# Patient Record
Sex: Female | Born: 1970 | Race: White | Hispanic: No | Marital: Married | Smoking: Never smoker
Health system: Southern US, Community
[De-identification: ages and names within clinical notes are randomized; demographics above are authoritative.]

## PROBLEM LIST (undated history)

## (undated) HISTORY — PX: ABDOMINAL HYSTERECTOMY: SHX81

---

## 1997-11-12 ENCOUNTER — Other Ambulatory Visit: Admission: RE | Admit: 1997-11-12 | Discharge: 1997-11-12 | Payer: Self-pay | Admitting: Obstetrics & Gynecology

## 2000-06-29 ENCOUNTER — Other Ambulatory Visit: Admission: RE | Admit: 2000-06-29 | Discharge: 2000-06-29 | Payer: Self-pay | Admitting: Obstetrics & Gynecology

## 2001-08-12 ENCOUNTER — Other Ambulatory Visit: Admission: RE | Admit: 2001-08-12 | Discharge: 2001-08-12 | Payer: Self-pay | Admitting: Obstetrics & Gynecology

## 2002-03-08 ENCOUNTER — Encounter: Payer: Self-pay | Admitting: Obstetrics & Gynecology

## 2002-03-08 ENCOUNTER — Inpatient Hospital Stay (HOSPITAL_COMMUNITY): Admission: EM | Admit: 2002-03-08 | Discharge: 2002-03-08 | Payer: Self-pay | Admitting: Emergency Medicine

## 2002-03-08 ENCOUNTER — Encounter: Payer: Self-pay | Admitting: Emergency Medicine

## 2002-07-24 ENCOUNTER — Other Ambulatory Visit: Admission: RE | Admit: 2002-07-24 | Discharge: 2002-07-24 | Payer: Self-pay | Admitting: Obstetrics & Gynecology

## 2003-02-10 ENCOUNTER — Other Ambulatory Visit: Admission: RE | Admit: 2003-02-10 | Discharge: 2003-02-10 | Payer: Self-pay | Admitting: Obstetrics & Gynecology

## 2003-10-06 ENCOUNTER — Observation Stay (HOSPITAL_COMMUNITY): Admission: RE | Admit: 2003-10-06 | Discharge: 2003-10-07 | Payer: Self-pay | Admitting: Obstetrics & Gynecology

## 2004-06-22 ENCOUNTER — Other Ambulatory Visit: Admission: RE | Admit: 2004-06-22 | Discharge: 2004-06-22 | Payer: Self-pay | Admitting: Obstetrics & Gynecology

## 2005-08-02 ENCOUNTER — Other Ambulatory Visit: Admission: RE | Admit: 2005-08-02 | Discharge: 2005-08-02 | Payer: Self-pay | Admitting: Obstetrics & Gynecology

## 2009-12-16 ENCOUNTER — Encounter: Admission: RE | Admit: 2009-12-16 | Discharge: 2009-12-16 | Payer: Self-pay | Admitting: Obstetrics & Gynecology

## 2010-03-23 ENCOUNTER — Encounter: Admission: RE | Admit: 2010-03-23 | Discharge: 2010-03-23 | Payer: Self-pay | Admitting: Obstetrics & Gynecology

## 2011-02-17 ENCOUNTER — Other Ambulatory Visit: Payer: Self-pay | Admitting: Dermatology

## 2011-02-24 NOTE — H&P (Signed)
[facility]  Patient:    Shannon Park, Shannon Park Visit Number: [id number] MRN: [id number]          Service Type: Attending:  Duane Lope, M.D. Dictated by:   Duane Lope, M.D. Adm. Date:  03/08/02                           History and Physical  HISTORY OF PRESENT ILLNESS:  The patient is a 40 year old white female, gravida 4, para 2, abortus 2 with last menstrual period around May 8 who was in her usual state of good health. She was actually shopping at [facility] about 9:00 last night, was walking in the parking lot and had acute onset of right lower quadrant pain that took her breath away and almost did not allow her to make it to the care. The pain, she stated, was the worst she has ever had including her vaginal delivery. The patient came to the emergency room and I was called about 1:00 regarding the patient. She had a normal white count, slight left shift. All of her laboratory data were normal and her pregnancy test was negative. The impression was that she could have an ovarian cyst with a possible torsion. By this time, she was given Toradol and her pain was significantly better. She was admitted to the hospital then for this diagnosis and kept n.p.o.  PAST MEDICAL HISTORY:  Negative.  PAST SURGICAL HISTORY:  Cesarean section.  PAST OB HISTORY:  Two miscarriages, one cesarean section, one vaginal delivery.  ALLERGIES:  CODEINE.  MEDICATIONS:  None.  REVIEW OF SYSTEMS:  Per the HPI but otherwise completely negative.  PHYSICAL EXAMINATION:  HEENT:  Unremarkable. Normal thyroid.  LUNGS:  Clear.  HEART:  Regular rate and rhythm without murmur, regurg or gallop.  BREASTS:  Deferred.  ABDOMEN:  Has severe tenderness in the right lower quadrant with rebound. The rest of the abdomen is benign.  PELVIC:  Deferred because of the patients severe pain.  EXTREMITIES:  Warm with no edema.  NEUROLOGIC:  Grossly intact.  I reviewed the sonogram  with the ultrasound technologist and the patient has an approximately 6 x 5 cm right ovarian cyst with blood flow pattern consistent with torsion. It is less on the left side and there is no free fluid in the cul-de-sac and it is a complex cyst consistent with hemorrhagic corpus luteum or possibly endometrioma.  IMPRESSION:  1. Right ovarian cyst with torsion.  2. Severe right lower quadrant pain.  PLAN:  The patient will be admitted to the hospital and will undergo a laparoscopic cystectomy. She also has pain with her periods and will do a laparoscopic uterosacral nerve oblation as well. She understands the risks, benefits, indications and alternatives and will proceed. She understands she could lose her ovary. Dictated by:   Duane Lope, M.D. Attending:  Duane Lope, M.D. DD:  03/08/02 TD:  03/08/02 Job: [id number]

## 2011-02-24 NOTE — Op Note (Signed)
[facility]  Patient:    Shannon Park, Shannon Park Visit Number: [id number] MRN: [id number]          Service Type: MED Location: 4A A416 [id number] Attending Physician:  Herbert Seta Dictated by:   Duane Lope, M.D. Proc. Date: 03/08/02 Admit Date:  03/07/2002                             Operative Report  PREOPERATIVE DIAGNOSES 1. A 6.0 cm x 5.0 cm right ovarian cyst. 2. Right ovarian torsion. 3. Severe acute right lower quadrant pain.  POSTOPERATIVE DIAGNOSES 1. A 6.0 cm x 5.0 cm right ovarian cyst. 2. Right ovarian torsion. 3. Severe acute right lower quadrant pain.  PROCEDURES 1. Laparoscopic right ovarian cystectomy. 2. Laparoscopic uterosacral nerve ablation.  SURGEON:  Duane Lope, M.D.  ANESTHESIA:  General endotracheal.  FINDINGS:  The patient had a large cyst found at the time of laparoscopy.  It was on the right ovary, and agreed with the ultrasound of approximately 6.0 cm x 5.0 cm.  It had blood and clear fluid in it.  It appeared from the cyst wall to be a hemorrhagic corpus luteum.  There was about 200 cc of fluid in the cyst itself, a lot of which was blood.  The left ovary was normal.  The posterior cul-de-sac, uterus, and both tubes were normal.  There was no evidence of endometriosis, and this certainly was not an endometrioma.  The appendix was normal.  DESCRIPTION OF OPERATION PERFORMED:  The patient was taken to the operating room and placed in the supine position, where she underwent general endotracheal anesthesia.  The vagina was prepped.  A Foley catheter was placed.  The abdomen was then prepped, and her lower extremities and abdomen were draped in the usual sterile fashion.  Marcaine 0.5% with 1:200,000 epinephrine was then injected subcu in the umbilical, suprapubic, and right lower quadrant incision areas.  An incision was made in the umbilicus.  A pistol grip was used, and under direct visualization the trocar was placed  in the peritoneal cavity and passed without difficulty.  The abdomen was insufflated.  Two finger breadths above the pubis an incision was made.  A 5.0 mm trocar was placed in the peritoneal cavity and passed without difficulty. An area was identified away from the inferior epigastric vessels in the right lower quadrant.  An incision was made and a 5.0 mm trocar was placed under direct visualization.  The ovary was cauterized on the surface and opened up. Fluid began to come out, and this was sucked out with the suction system, and 200 cc of fluid was in the cyst itself.  I then used a hair curler technique and separated the cyst from the ovarian wall, and removed the cyst in toto without difficulty.  The bed of the cyst was bleeding fairly significantly in the area of the infundibular pelvic ligament.  I tried monopolar and harmonic scalpel cautery, and finally had to use bipolar cautery to get the area to stop bleeding.  I did not compromise the ovarian vessels. There was good hemostasis.  The pelvis was irrigated vigorously several times.  A laparoscopic uterosacral nerve ablation was performed using the harmonic scalpel without difficulty, with good hemostasis.  The pelvis was once again irrigated vigorously.  All pedicles were found to be hemostatic.  The instruments were removed.  The gas was allowed to escape.  The fascia was closed with a  single #0 Vicryl suture.  All skin incisions were closed using #3-0 Vicryl in a subcuticular fashion.  The patient tolerated the procedure well.  She experienced 300 cc of blood loss, and was taken to the recovery room in good stable condition.  All counts were correct x 3. Dictated by:   Duane Lope, M.D. Attending Physician:  Herbert Seta DD:  03/08/02 TD:  03/10/02 Job: [id number]

## 2011-02-24 NOTE — Discharge Summary (Signed)
[facility]  Patient:    Shannon Park, Shannon Park Visit Number: [id number] MRN: [id number]          Service Type: MED Location: 4A A416 01 Attending Physician:  Lazaro Arms Dictated by:   Duane Lope, M.D. Admit Date:  03/07/2002 Discharge Date: 03/08/2002                             Discharge Summary  DISCHARGE DIAGNOSES: 1. Status post laparoscopic right ovarian cystectomy. 2. Status post laparoscopic ureterosacral nerve oblation. 3. Right ovarian cyst with torsion.  PROCEDURES:  As above.  Please refer to the transcribed history and physical with details of admission to the hospital.  HOSPITAL COURSE:  The patient was admitted, was observed for a short period of time and had an ultrasound, felt as though she had diminished blood flow to the right ovary as compared to the left and also had a large cyst consistent with a cyst with torsion and her pain was consistent. As a result, she was taken for a laparoscopic right ovarian cystectomy and I did a laparoscopic uterosacral nerve oblation as well because of her dysmenorrhea. The procedure went well. She had an unremarkable postoperative course except had some uvular edema, otherwise, her abdomen was benign. She tolerated clear liquids and her pain medicine. She was discharged to home just a few hours after surgery to take Tylox for pain, Toradol around the clock every 8 hours for 5 days and she will be seen in the office in a couple of days for evaluation. She was given instructions to gargle and use Chloraseptic because of her enlarged uvula. She will be seen in the office in a couple of days and was given instructions and precautions for return prior to that time. Dictated by:   Duane Lope, M.D.  Attending Physician:  Lazaro Arms DD:  03/31/02 TD:  03/31/02 Job: [id number]

## 2014-12-28 ENCOUNTER — Other Ambulatory Visit: Payer: Self-pay

## 2014-12-29 LAB — CYTOLOGY - PAP

## 2019-10-24 ENCOUNTER — Ambulatory Visit: Payer: BC Managed Care – PPO | Attending: Internal Medicine

## 2019-10-24 ENCOUNTER — Other Ambulatory Visit: Payer: Self-pay

## 2019-10-24 DIAGNOSIS — Z20822 Contact with and (suspected) exposure to covid-19: Secondary | ICD-10-CM

## 2019-10-24 DIAGNOSIS — U071 COVID-19: Secondary | ICD-10-CM | POA: Insufficient documentation

## 2019-10-25 LAB — NOVEL CORONAVIRUS, NAA: SARS-CoV-2, NAA: DETECTED — AB

## 2019-11-11 ENCOUNTER — Emergency Department (HOSPITAL_COMMUNITY)
Admission: EM | Admit: 2019-11-11 | Discharge: 2019-11-11 | Disposition: A | Discharge status: Home / Self Care | Payer: BC Managed Care – PPO | Attending: Emergency Medicine | Admitting: Emergency Medicine

## 2019-11-11 ENCOUNTER — Ambulatory Visit (INDEPENDENT_AMBULATORY_CARE_PROVIDER_SITE_OTHER)
Admission: EM | Admit: 2019-11-11 | Discharge: 2019-11-11 | Disposition: A | Discharge status: Home / Self Care | Payer: BC Managed Care – PPO | Source: Home / Self Care

## 2019-11-11 ENCOUNTER — Encounter (HOSPITAL_COMMUNITY): Payer: Self-pay

## 2019-11-11 ENCOUNTER — Other Ambulatory Visit: Payer: Self-pay

## 2019-11-11 ENCOUNTER — Emergency Department (HOSPITAL_COMMUNITY): Payer: BC Managed Care – PPO

## 2019-11-11 DIAGNOSIS — R071 Chest pain on breathing: Secondary | ICD-10-CM | POA: Diagnosis not present

## 2019-11-11 DIAGNOSIS — Z885 Allergy status to narcotic agent status: Secondary | ICD-10-CM | POA: Insufficient documentation

## 2019-11-11 DIAGNOSIS — U071 COVID-19: Secondary | ICD-10-CM | POA: Diagnosis not present

## 2019-11-11 DIAGNOSIS — R0789 Other chest pain: Secondary | ICD-10-CM | POA: Diagnosis present

## 2019-11-11 DIAGNOSIS — Z79899 Other long term (current) drug therapy: Secondary | ICD-10-CM | POA: Diagnosis not present

## 2019-11-11 LAB — CBC WITH DIFFERENTIAL/PLATELET
Abs Immature Granulocytes: 0.02 10*3/uL (ref 0.00–0.07)
Basophils Absolute: 0 10*3/uL (ref 0.0–0.1)
Basophils Relative: 0 %
Eosinophils Absolute: 0.1 10*3/uL (ref 0.0–0.5)
Eosinophils Relative: 2 %
HCT: 37.8 % (ref 36.0–46.0)
Hemoglobin: 12.8 g/dL (ref 12.0–15.0)
Immature Granulocytes: 0 %
Lymphocytes Relative: 15 %
Lymphs Abs: 0.9 10*3/uL (ref 0.7–4.0)
MCH: 31.1 pg (ref 26.0–34.0)
MCHC: 33.9 g/dL (ref 30.0–36.0)
MCV: 92 fL (ref 80.0–100.0)
Monocytes Absolute: 0.4 10*3/uL (ref 0.1–1.0)
Monocytes Relative: 7 %
Neutro Abs: 4.7 10*3/uL (ref 1.7–7.7)
Neutrophils Relative %: 76 %
Platelets: 209 10*3/uL (ref 150–400)
RBC: 4.11 MIL/uL (ref 3.87–5.11)
RDW: 11.9 % (ref 11.5–15.5)
WBC: 6.1 10*3/uL (ref 4.0–10.5)
nRBC: 0 % (ref 0.0–0.2)

## 2019-11-11 LAB — COMPREHENSIVE METABOLIC PANEL
ALT: 18 U/L (ref 0–44)
AST: 18 U/L (ref 15–41)
Albumin: 4.1 g/dL (ref 3.5–5.0)
Alkaline Phosphatase: 63 U/L (ref 38–126)
Anion gap: 10 (ref 5–15)
BUN: 9 mg/dL (ref 6–20)
CO2: 27 mmol/L (ref 22–32)
Calcium: 9.4 mg/dL (ref 8.9–10.3)
Chloride: 100 mmol/L (ref 98–111)
Creatinine, Ser: 0.61 mg/dL (ref 0.44–1.00)
GFR calc Af Amer: >60 mL/min (ref >60)
GFR calc non Af Amer: >60 mL/min (ref >60)
Glucose, Bld: 109 mg/dL — ABNORMAL HIGH (ref 70–99)
Potassium: 4.6 mmol/L (ref 3.5–5.1)
Sodium: 137 mmol/L (ref 135–145)
Total Bilirubin: 0.6 mg/dL (ref 0.3–1.2)
Total Protein: 7.4 g/dL (ref 6.5–8.1)

## 2019-11-11 LAB — TROPONIN I (HIGH SENSITIVITY)
Troponin I (High Sensitivity): <2 ng/L (ref <18)
Troponin I (High Sensitivity): <2 ng/L (ref <18)

## 2019-11-11 MED ORDER — ALBUTEROL SULFATE HFA 108 (90 BASE) MCG/ACT IN AERS: 2.0000 | INHALATION_SPRAY | Freq: Four times a day (QID) | RESPIRATORY_TRACT | 0 refills | Status: AC | PRN

## 2019-11-11 MED ORDER — IOHEXOL 350 MG/ML SOLN
100.0000 mL | Freq: Once | INTRAVENOUS | Status: AC | PRN
  Administered 2019-11-11: 14:00:00 100 mL via INTRAVENOUS

## 2019-11-11 NOTE — ED Provider Notes (Signed)
[facility] EMERGENCY DEPARTMENT Provider Note   CSN: [id number] Arrival date & time: 11/11/19  1053     History Chief Complaint  Patient presents with  . Chest Pain    COVID + January 15th     Shannon Park is a 49 y.o. female.  Patient with history of Covid infection the last couple weeks.  Patient complains of some shortness of breath and neck and chest discomfort.  The history is provided by the patient.  Chest Pain Pain location:  Substernal area Pain quality: aching   Pain radiates to:  Does not radiate Pain severity:  Mild Onset quality:  Sudden Timing:  Intermittent Progression:  Waxing and waning Associated symptoms: no abdominal pain, no back pain, no cough, no fatigue and no headache        History reviewed. No pertinent past medical history.  There are no problems to display for this patient.   Past Surgical History:  Procedure Laterality Date  . ABDOMINAL HYSTERECTOMY    . CESAREAN SECTION       OB History   No obstetric history on file.     No family history on file.  Social History   Tobacco Use  . Smoking status: Never Smoker  . Smokeless tobacco: Never Used  Substance Use Topics  . Alcohol use: Yes    Comment: Occasional  . Drug use: Never    Home Medications Prior to Admission medications   Medication Sig Start Date End Date Taking? Authorizing Provider  Cyanocobalamin (VITAMIN B 12 PO) Take 1 tablet by mouth daily.   Yes [provider]  estradiol (ESTRACE) 2 MG tablet Take 2 mg by mouth daily.   Yes [provider]  Fluoxetine HCl, PMDD, (SARAFEM) 20 MG TABS Take 1 tablet by mouth daily.   Yes [provider]  MegaRed Omega-3 Krill Oil 500 MG CAPS Take 1 capsule by mouth daily.   Yes [provider]  Multiple Vitamin (MULTIVITAMIN WITH MINERALS) TABS tablet Take 1 tablet by mouth daily.   Yes [provider]  VITAMIN D PO Take 1 capsule by mouth daily.   Yes [provider]    albuterol (VENTOLIN HFA) 108 (90 Base) MCG/ACT inhaler Inhale 2 puffs into the lungs every 6 (six) hours as needed for wheezing or shortness of breath. 11/11/19   Milton Ferguson, MD    Allergies    Codeine  Review of Systems   Review of Systems  Constitutional: Negative for appetite change and fatigue.  HENT: Negative for congestion, ear discharge and sinus pressure.        Neck discomfort  Eyes: Negative for discharge.  Respiratory: Negative for cough.   Cardiovascular: Positive for chest pain.  Gastrointestinal: Negative for abdominal pain and diarrhea.  Genitourinary: Negative for frequency and hematuria.  Musculoskeletal: Negative for back pain.  Skin: Negative for rash.  Neurological: Negative for seizures and headaches.  Psychiatric/Behavioral: Negative for hallucinations.    Physical Exam Updated Vital Signs BP 129/78   Pulse 70   Temp 98 F (36.7 C) (Oral)   Resp 13   Ht 5\' 4"  (1.626 m)   Wt 56.7 kg   SpO2 100%   BMI 21.46 kg/m   Physical Exam Vitals and nursing note reviewed.  Constitutional:      Appearance: She is well-developed.  HENT:     Head: Normocephalic.     Nose: Nose normal.  Eyes:     General: No scleral icterus.  Conjunctiva/sclera: Conjunctivae normal.  Neck:     Thyroid: No thyromegaly.  Cardiovascular:     Rate and Rhythm: Normal rate and regular rhythm.     Heart sounds: No murmur. No friction rub. No gallop.   Pulmonary:     Breath sounds: No stridor. No wheezing or rales.  Chest:     Chest wall: No tenderness.  Abdominal:     General: There is no distension.     Tenderness: There is no abdominal tenderness. There is no rebound.  Musculoskeletal:        General: Normal range of motion.     Cervical back: Neck supple.  Lymphadenopathy:     Cervical: No cervical adenopathy.  Skin:    Findings: No erythema or rash.  Neurological:     Mental Status: She is oriented to person, place, and time.     Motor: No abnormal muscle  tone.     Coordination: Coordination normal.  Psychiatric:        Behavior: Behavior normal.     ED Results / Procedures / Treatments   Labs (all labs ordered are listed, but only abnormal results are displayed) Labs Reviewed  COMPREHENSIVE METABOLIC PANEL - Abnormal; Notable for the following components:      Result Value   Glucose, Bld 109 (*)    All other components within normal limits  CBC WITH DIFFERENTIAL/PLATELET  TROPONIN I (HIGH SENSITIVITY)  TROPONIN I (HIGH SENSITIVITY)    EKG EKG Interpretation  Date/Time:  Tuesday November 11 2019 11:47:46 EST Ventricular Rate:  69 PR Interval:    QRS Duration: 93 QT Interval:  403 QTC Calculation: 432 R Axis:   70 Text Interpretation: Sinus rhythm Confirmed by Bethann Berkshire [phone]) on 11/11/2019 2:35:29 PM   Radiology CT Angio Chest PE W and/or Wo Contrast  Result Date: 11/11/2019 CLINICAL DATA:  Diagnosed with COVID-19 on 10/24/2019, intermittent chest pain now radiating to neck, progressively worsening shortness of breath EXAM: CT ANGIOGRAPHY CHEST WITH CONTRAST TECHNIQUE: Multidetector CT imaging of the chest was performed using the standard protocol during bolus administration of intravenous contrast. Multiplanar CT image reconstructions and MIPs were obtained to evaluate the vascular anatomy. CONTRAST:  OMNIPAQUE IOHEXOL 350 MG/ML SOLN IV COMPARISON:  None FINDINGS: Cardiovascular: Aorta normal caliber without aneurysm or dissection. Minimal pericardial fluid. Heart otherwise unremarkable. Pulmonary arteries well opacified and patent. No evidence of pulmonary embolism. Mediastinum/Nodes: Esophagus unremarkable. Base of cervical region normal appearance. No thoracic adenopathy. Lungs/Pleura: Minimal dependent atelectasis in the posterior lower lobes. Lungs otherwise clear. No pulmonary infiltrate, pleural effusion or pneumothorax. 4 mm RIGHT upper lobe nodule image 34. 3 mm LEFT upper lobe nodule image 60. Subpleural nodule  5 mm diameter LEFT lower lobe image 85. Upper Abdomen: Unremarkable Musculoskeletal: No osseous findings. Review of the MIP images confirms the above findings. IMPRESSION: No evidence of pulmonary embolism. Minimal dependent atelectasis in the posterior lower lobes. BILATERAL lung nodules up to 5 mm diameter, recommendation below. No follow-up needed if patient is low-risk (and has no known or suspected primary neoplasm). Non-contrast chest CT can be considered in 12 months if patient is high-risk. This recommendation follows the consensus statement: Guidelines for Management of Incidental Pulmonary Nodules Detected on CT Images: From the Fleischner Society 2017; Radiology 2017; 284:228-243. Electronically Signed   By: Ulyses Southward M.D.   On: 11/11/2019 14:13    Procedures Procedures (including critical care time)  Medications Ordered in ED Medications  iohexol (OMNIPAQUE) 350 MG/ML injection 100 mL (  100 mLs Intravenous Contrast Given 11/11/19 1347)    ED Course  I have reviewed the triage vital signs and the nursing notes.  Pertinent labs & imaging results that were available during my care of the patient were reviewed by me and considered in my medical decision making (see chart for details).    MDM Rules/Calculators/A&P                      Patient with history of Covid infection.  Patient with neck discomfort along with some chest discomfort and shortness of breath.  Labs are unremarkable CT angio of the chest showed no blood clots.  Patient will be discharged home with albuterol inhaler and referred to ENT along with following up with her family doctor for blood pressure Final Clinical Impression(s) / ED Diagnoses Final diagnoses:  COVID-19    Rx / DC Orders ED Discharge Orders         Ordered    albuterol (VENTOLIN HFA) 108 (90 Base) MCG/ACT inhaler  Every 6 hours PRN     11/11/19 1450           Bethann Berkshire, MD 11/11/19 1454

## 2019-11-11 NOTE — ED Triage Notes (Signed)
Pt presents with c/o chest tightness, sob and neck pressure that progressively gotten worse for past 2 weeks , post covid diagnosis

## 2019-11-11 NOTE — Discharge Instructions (Signed)
Use your inhaler to help with shortness of breath.  Follow-up with your family doctor for your blood pressure return to the emergency department as needed.  And you can see the ENT doctor Friday neck problems

## 2019-11-11 NOTE — ED Triage Notes (Addendum)
Pt diagnosed with COVID January 15th. Has been having intermittent chest pain that is now radiating to her neck. SOB progressively worse. Pt is normally a healthy individual. EKG done at Urgent Care is at bedside.

## 2021-12-12 ENCOUNTER — Other Ambulatory Visit: Payer: Self-pay | Admitting: Obstetrics & Gynecology

## 2021-12-12 DIAGNOSIS — R928 Other abnormal and inconclusive findings on diagnostic imaging of breast: Secondary | ICD-10-CM

## 2021-12-27 ENCOUNTER — Ambulatory Visit: Payer: BC Managed Care – PPO

## 2021-12-27 ENCOUNTER — Ambulatory Visit
Admission: RE | Admit: 2021-12-27 | Discharge: 2021-12-27 | Disposition: A | Discharge status: Home / Self Care | Payer: BC Managed Care – PPO | Source: Ambulatory Visit | Attending: Obstetrics & Gynecology | Admitting: Obstetrics & Gynecology

## 2021-12-27 DIAGNOSIS — R928 Other abnormal and inconclusive findings on diagnostic imaging of breast: Secondary | ICD-10-CM

## 2024-03-18 IMAGING — MG MM DIGITAL DIAGNOSTIC UNILAT*R* W/ TOMO W/ CAD
4 series · 4 of 12 positions shown · non-contrast
Comparison: Prior films

CLINICAL DATA: Callback from screening mammogram for possible
asymmetry right breast

EXAM:
DIGITAL DIAGNOSTIC UNILATERAL RIGHT MAMMOGRAM WITH TOMOSYNTHESIS AND
CAD
TECHNIQUE: Right digital diagnostic mammography and breast tomosynthesis was
performed. The images were evaluated with computer-aided detection.

[R MLO synth-2D]
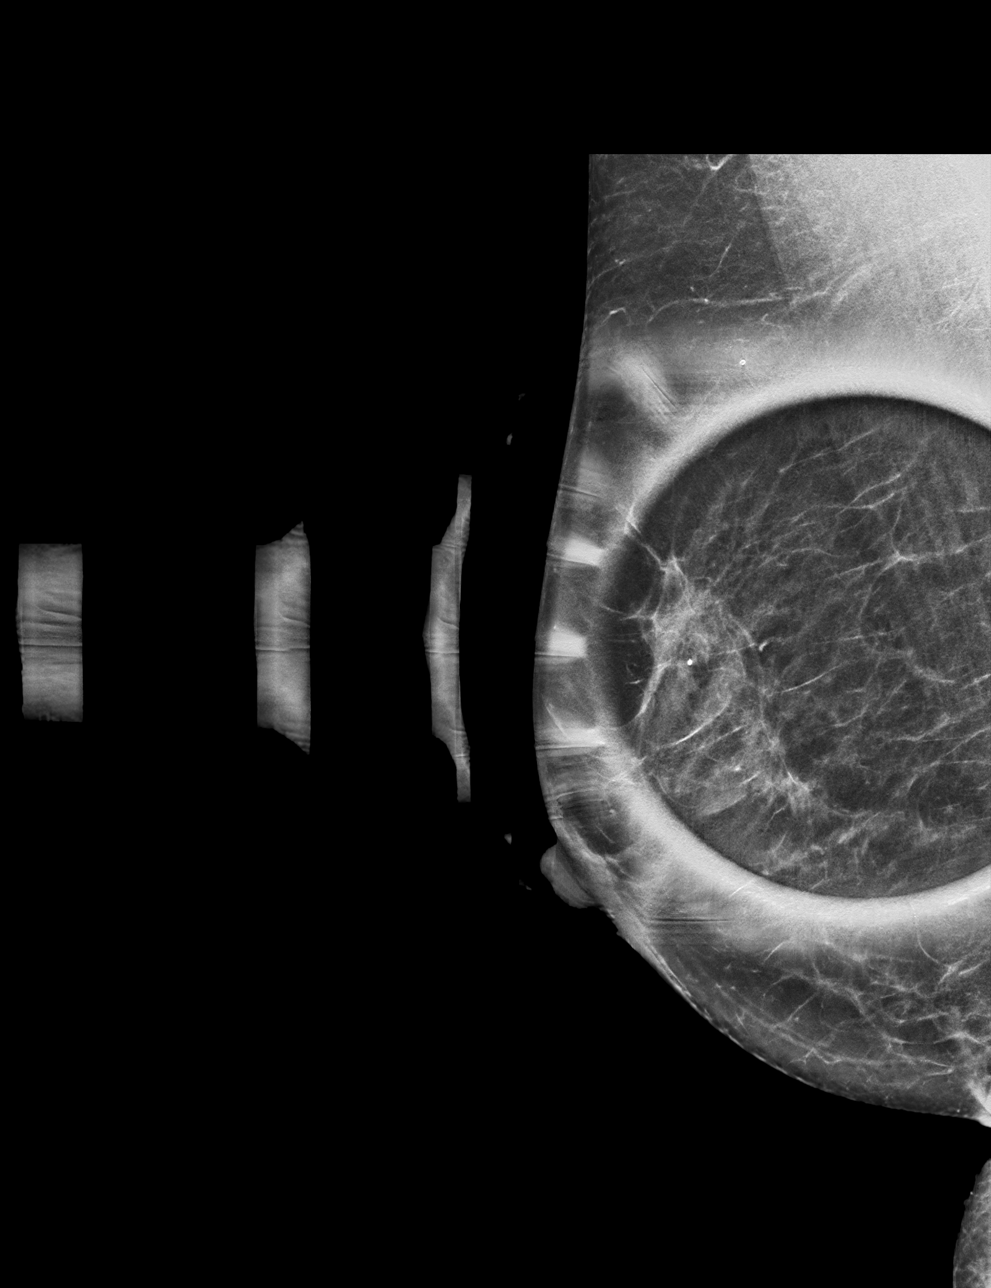

[R ML synth-2D]
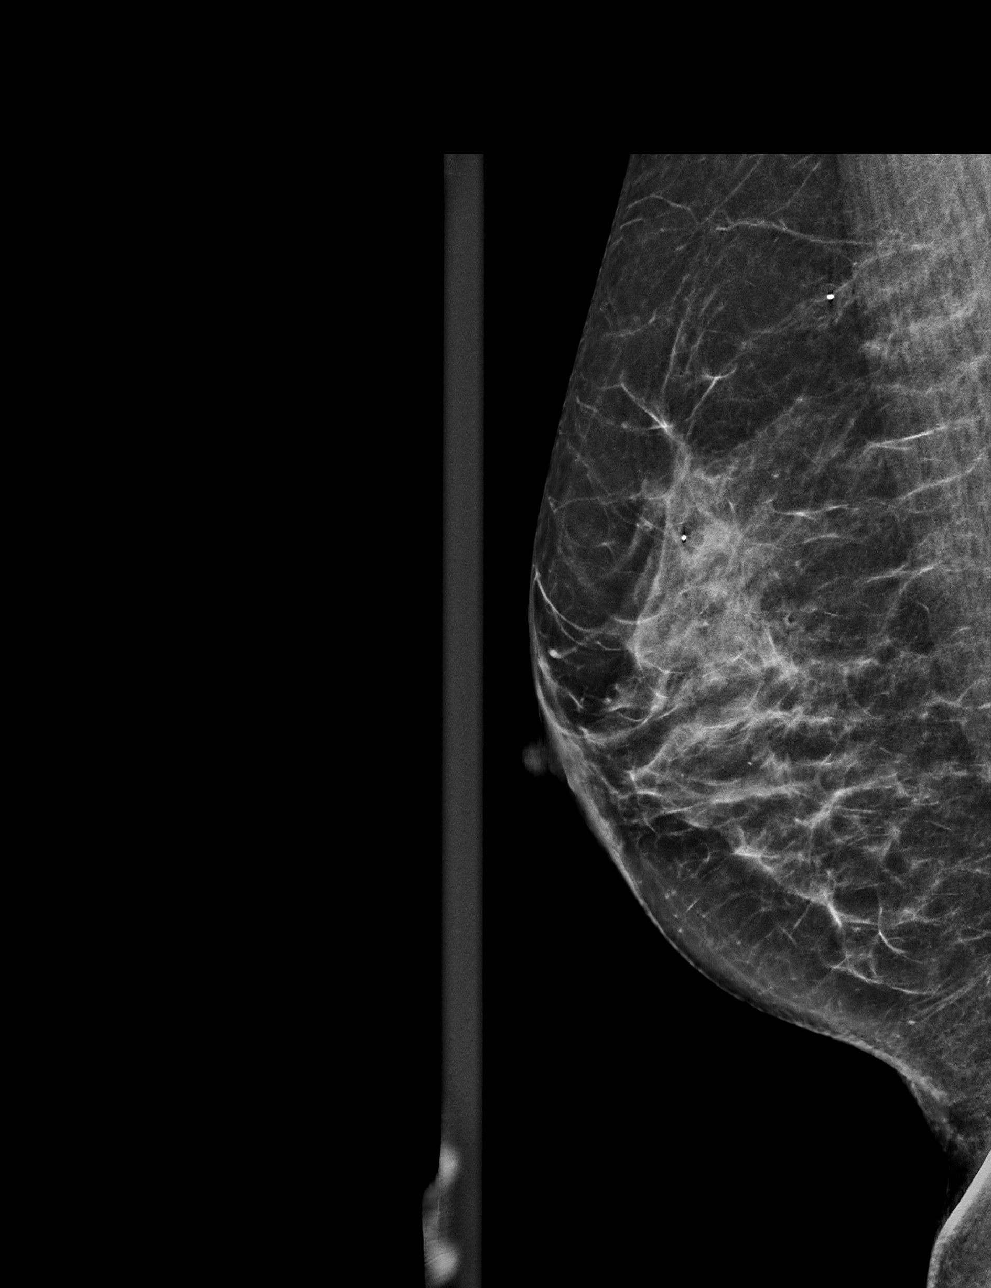

[R MLO tomo · tomo slice 29/57.0]
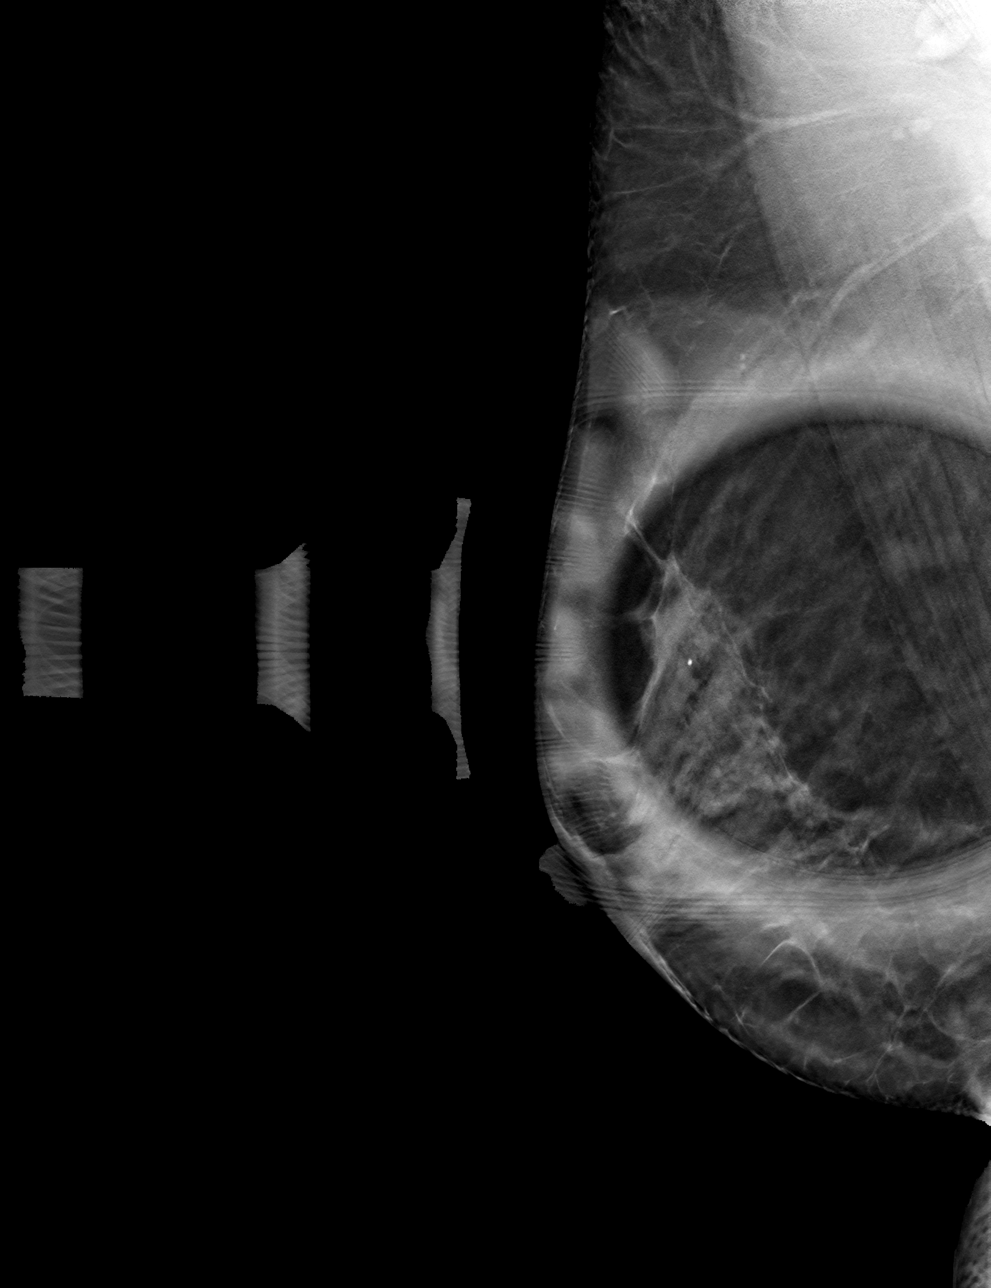

[R ML tomo · tomo slice 29/57.0]
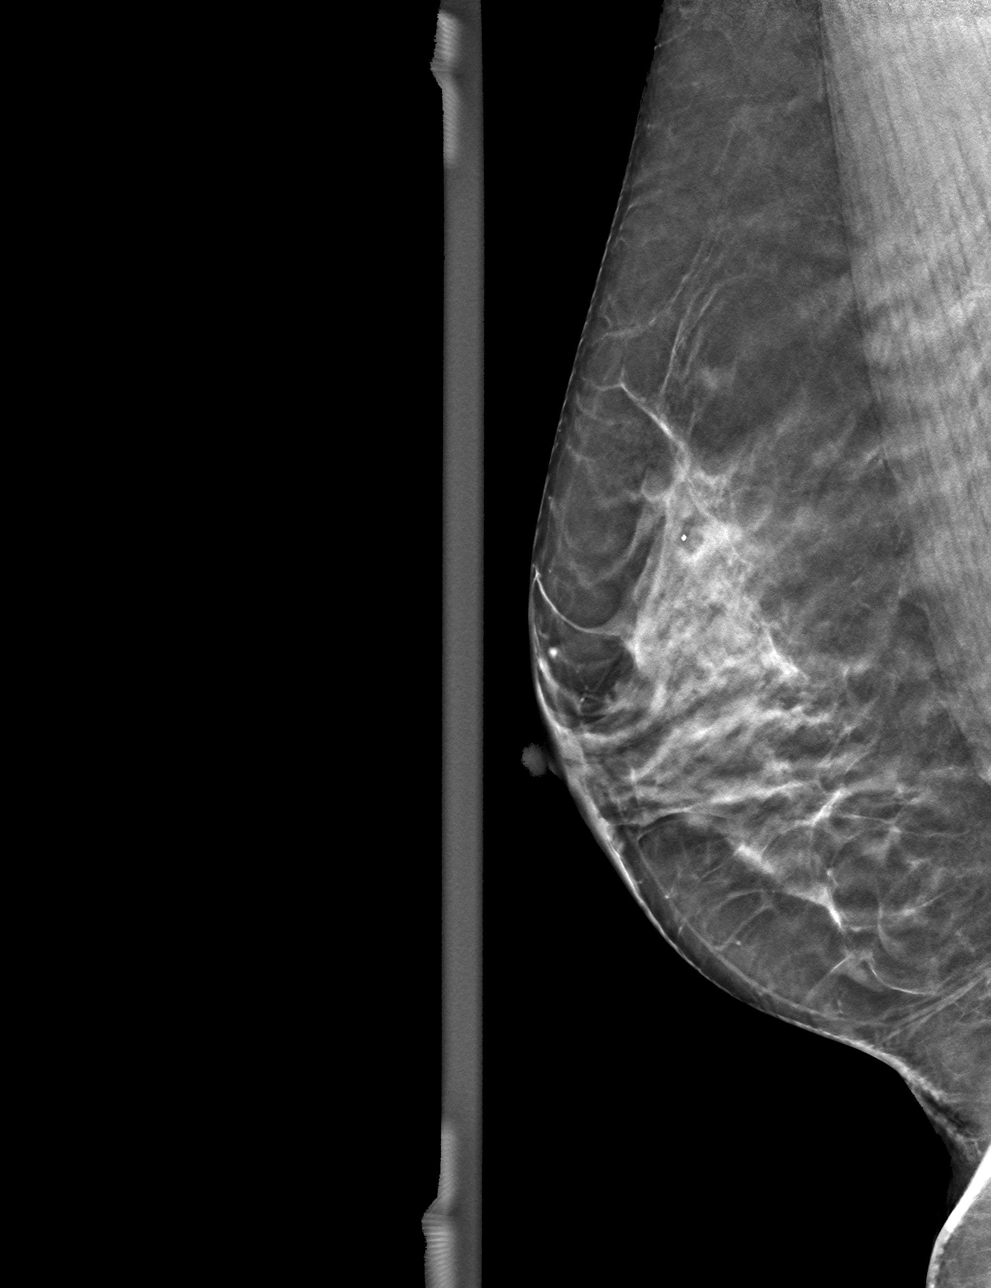

[4 of 12 positions shown; findings below may reference images not displayed]

ACR Breast Density Category c: The breast tissue is heterogeneously
dense, which may obscure small masses.
FINDINGS: Spot compression right MLO view, lateral view of right breast are
submitted. The previously questioned asymmetry does not persist on
additional views.
IMPRESSION: Negative.

RECOMMENDATION:
Routine screening mammogram back on schedule.

I have discussed the findings and recommendations with the patient.
If applicable, a reminder letter will be sent to the patient
regarding the next appointment.

BI-RADS CATEGORY  1: Negative.
